# Patient Record
Sex: Male | Born: 1990 | Race: Black or African American | Hispanic: No | Marital: Single | Smoking: Never smoker
Health system: Southern US, Community
[De-identification: ages and names within clinical notes are randomized; demographics above are authoritative.]

## PROBLEM LIST (undated history)

## (undated) DIAGNOSIS — F419 Anxiety disorder, unspecified: Secondary | ICD-10-CM

## (undated) DIAGNOSIS — F411 Generalized anxiety disorder: Secondary | ICD-10-CM

## (undated) HISTORY — DX: Anxiety disorder, unspecified: F41.9

## (undated) HISTORY — DX: Generalized anxiety disorder: F41.1

---
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
## 2010-01-05 IMAGING — CR DG KNEE COMPLETE 4+V*L*
1 series · 4 of 4 positions shown · non-contrast
Comparison: none

REASON FOR EXAM: CHRONIC KNEE PAIN LT > RT
COMMENTS:

PROCEDURE:     MDR - MDR KNEE LT COMPLETE W/OBLIQUES  - January 13, 2008  [DATE]
RESULT:     No fracture, dislocation or other acute bony abnormality is
identified. The knee joint space is well maintained. The patella is intact.

[Series 1: view not recorded · 0.17mm/px · 4 of 4 slices shown]
[im 1/4]
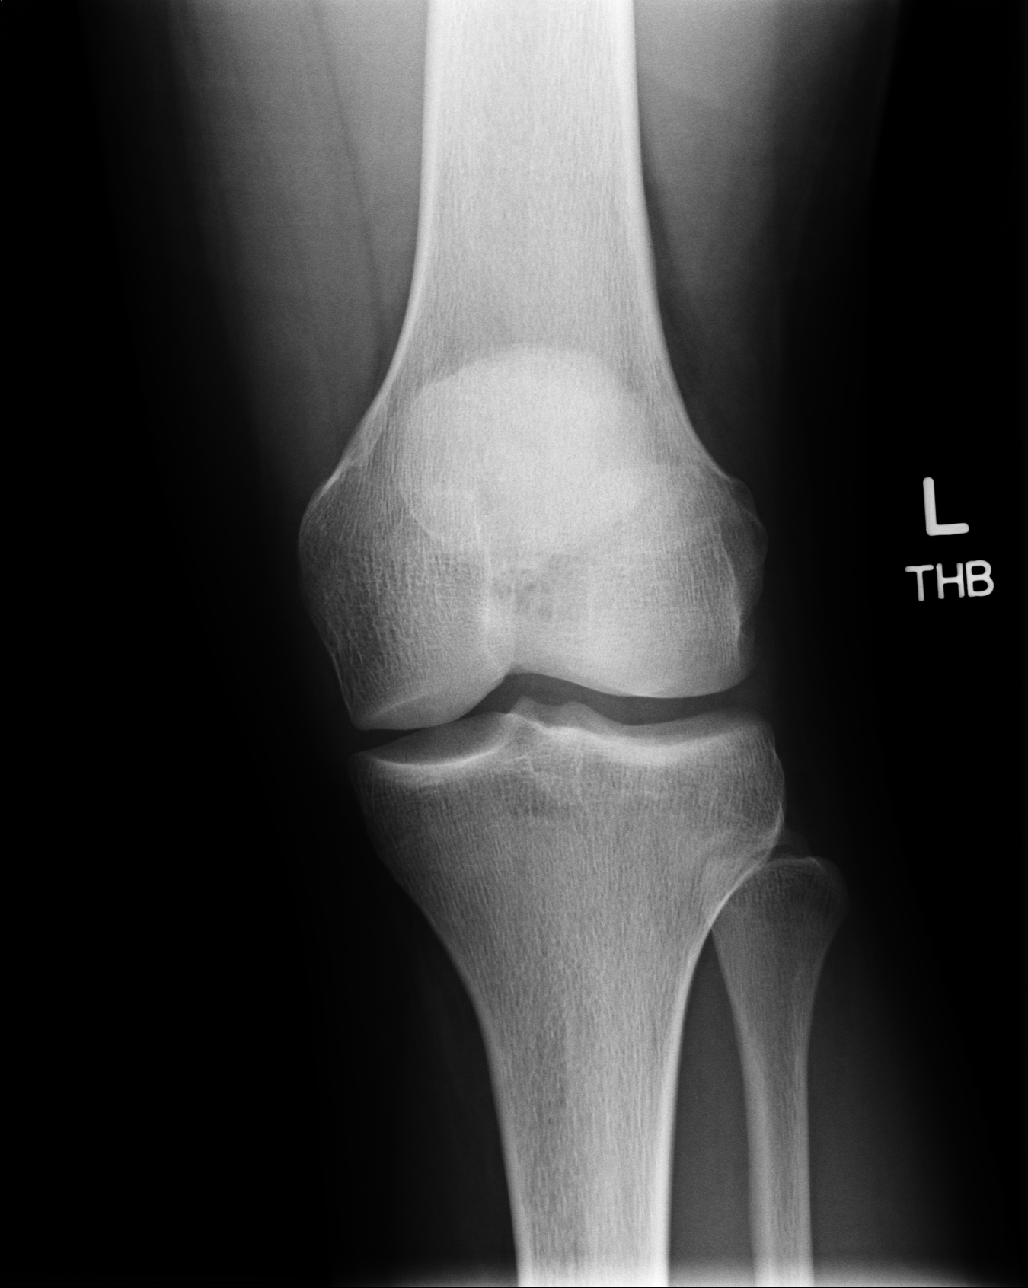
[im 2/4]
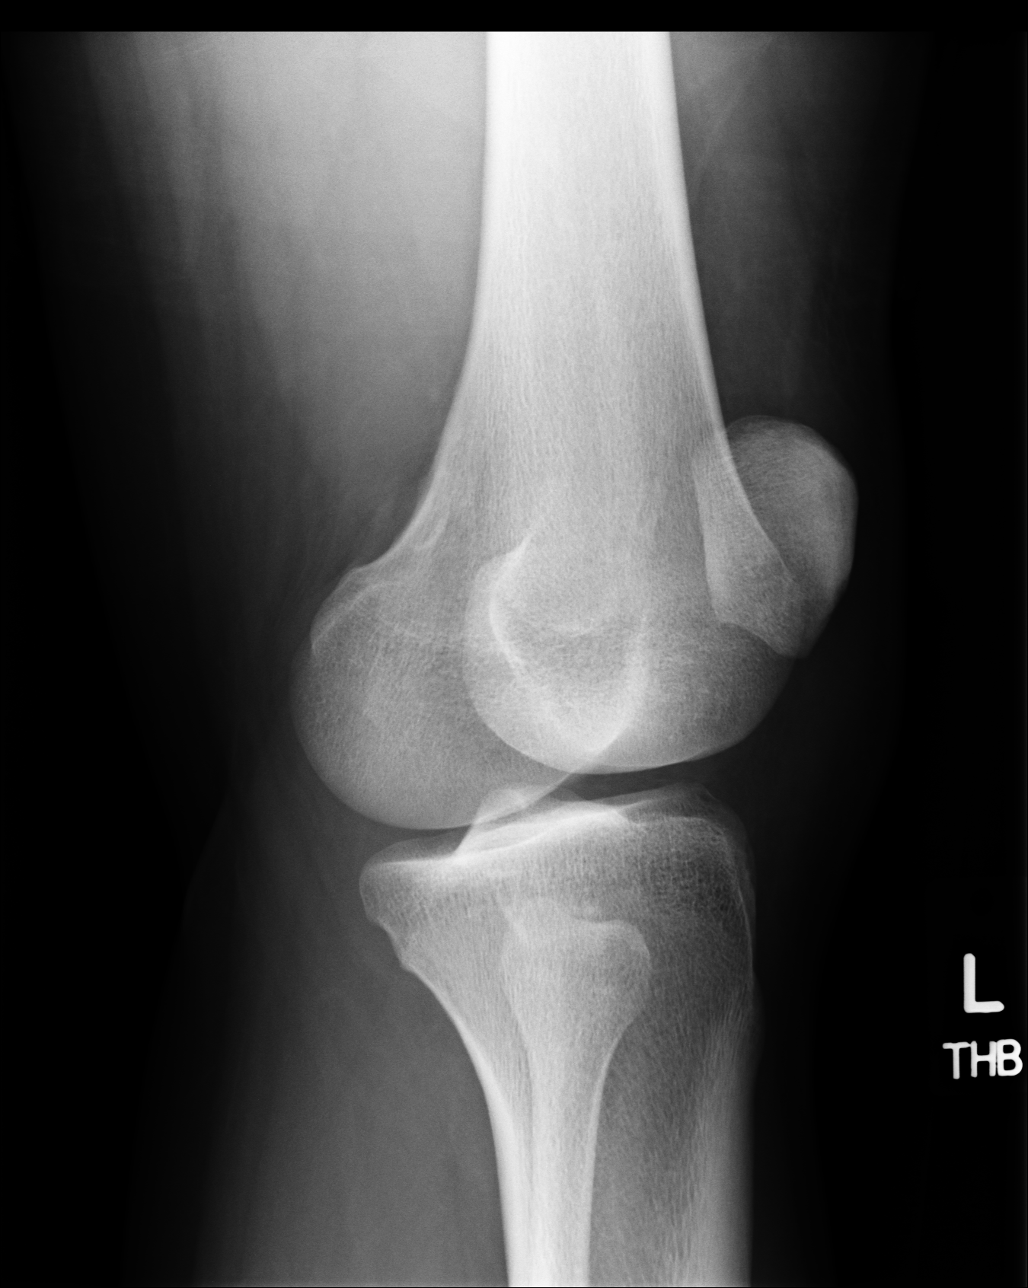
[im 3/4]
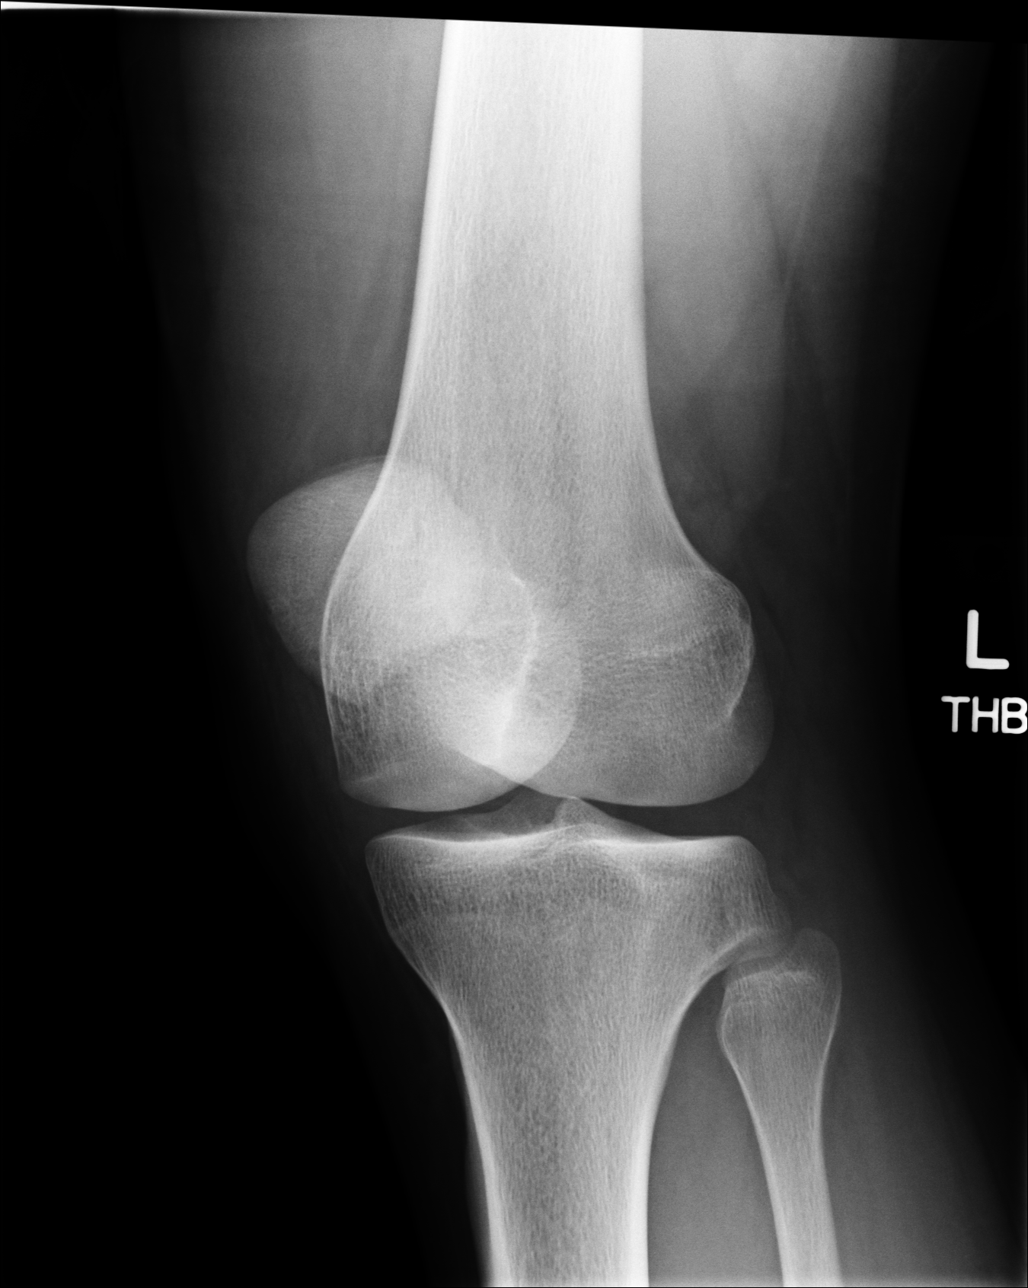
[im 4/4]
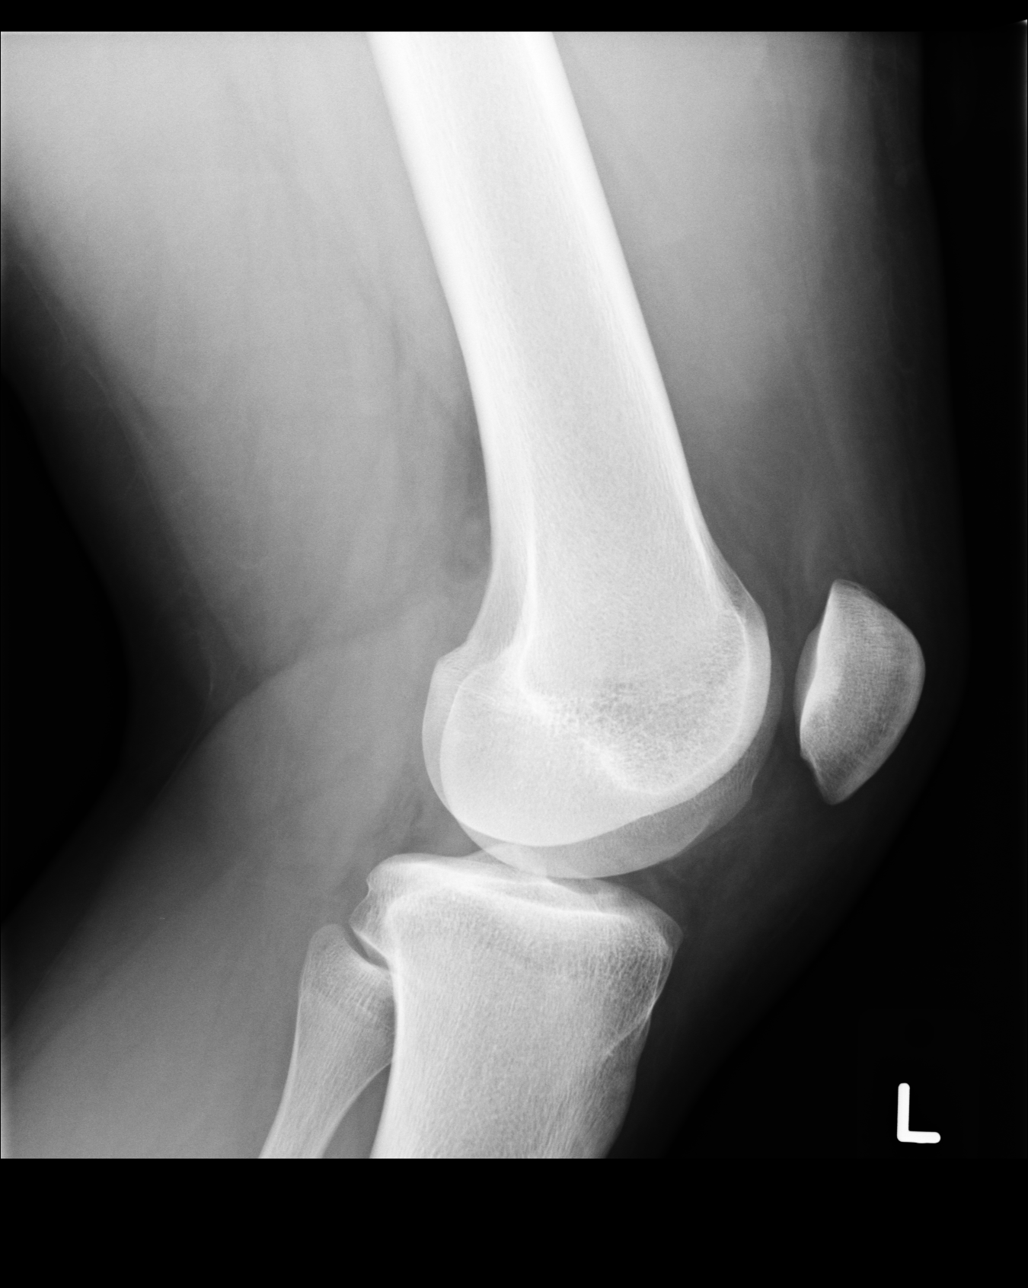

[4 of 4 positions shown; findings below may reference images not displayed]

IMPRESSION: 1.     No acute changes are identified.

## 2011-04-24 ENCOUNTER — Emergency Department: Payer: Self-pay | Admitting: Emergency Medicine

## 2014-06-11 ENCOUNTER — Emergency Department: Payer: Self-pay | Admitting: Emergency Medicine

## 2015-09-06 ENCOUNTER — Encounter: Payer: Self-pay | Admitting: Primary Care

## 2015-09-06 ENCOUNTER — Ambulatory Visit (INDEPENDENT_AMBULATORY_CARE_PROVIDER_SITE_OTHER): Payer: Commercial Managed Care - HMO | Admitting: Primary Care

## 2015-09-06 VITALS — BP 126/84 | HR 83 | Temp 98.0°F | Ht 75.0 in | Wt 224.4 lb

## 2015-09-06 DIAGNOSIS — F411 Generalized anxiety disorder: Secondary | ICD-10-CM | POA: Diagnosis not present

## 2015-09-06 MED ORDER — ESCITALOPRAM OXALATE 10 MG PO TABS: 10.0000 mg | ORAL_TABLET | Freq: Every day | ORAL | Status: DC

## 2015-09-06 NOTE — Progress Notes (Signed)
   Subjective:    Patient ID: Justin Mccarthy, male    DOB: April 01, 1991, 25 y.o.   MRN: [id number]  HPI  Justin Mccarthy is a 25 year old male who presents today to establish care and discuss the problems mentioned below. Will obtain old records. His last physical was 2 years ago.  1) Generalized Anxiety Disorder: History of in grade school and high school years. Once managed on numerous medication years ago but does not remember which ones in particular. He endorses going through a "rough time" right now and has had increased anxiety that has been present since early January 2017. He's had 2-3 anxiety attacks over the last several weeks with the last anxiety attack being Friday last week that required him to have to take a short leave from work. Over the past 2 weeks he's experienced heart palpitations, shortness of breath, feeling weakness to his legs.  He has been working through his anxiety with deep breathing exercisins and has been able to calm himself down. Denies SI/HI, symptoms of depression. GAD 7 score of 15 today.   His last major episode was in November of 2015 as he presented to the ED due to increased anxiety.    Review of Systems  Constitutional: Negative for unexpected weight change.  HENT: Negative for rhinorrhea.   Respiratory: Positive for shortness of breath. Negative for cough.   Cardiovascular: Positive for palpitations. Negative for chest pain.  Gastrointestinal: Negative for diarrhea and constipation.  Genitourinary: Negative for difficulty urinating.  Musculoskeletal: Negative for myalgias and arthralgias.  Skin: Negative for rash.  Allergic/Immunologic: Positive for environmental allergies.  Neurological: Negative for dizziness, numbness and headaches.       Past Medical History  Diagnosis Date  . Generalized anxiety disorder     Social History   Social History  . Marital Status: Single    Spouse Name: N/A  . Number of Children: N/A  . Years of Education: N/A     Occupational History  . Not on file.   Social History Main Topics  . Smoking status: Never Smoker   . Smokeless tobacco: Not on file  . Alcohol Use: No  . Drug Use: No  . Sexual Activity: Not on file   Other Topics Concern  . Not on file   Social History Narrative   Single.   No children.   Works at [employer].    Enjoys working out at [employer]    History reviewed. No pertinent past surgical history.  Family History  Problem Relation Age of Onset  . Heart disease Maternal Grandmother     No Known Allergies  No current outpatient prescriptions on file prior to visit.   No current facility-administered medications on file prior to visit.    BP 126/84 mmHg  Pulse 83  Temp(Src) 98 F (36.7 C) (Oral)  Ht  (1.905 m)  Wt 224 lb 6.4 oz (101.787 kg)  BMI 28.05 kg/m2  SpO2 98%    Objective:   Physical Exam  Constitutional: He is oriented to person, place, and time. He appears well-nourished.  Cardiovascular: Normal rate and regular rhythm.   Pulmonary/Chest: Effort normal and breath sounds normal.  Neurological: He is alert and oriented to person, place, and time.  Skin: Skin is warm and dry.  Psychiatric: He has a normal mood and affect.  Poor eye contact, but cooperative and pleasant.           Assessment & Plan:

## 2015-09-06 NOTE — Patient Instructions (Signed)
Start Lexapro tablets for Generalized Anxiety Disorder. Take 1/2 tablet by mouth daily for 6 days, then advance to 1 full tablet thereafter.  This medication will take 4-6 weeks to reach its full potential. Please notify me if you'd like to consider therapy.  Follow up in 4-6 weeks for re-evaluation and for complete physical.  It was a pleasure to meet you today! Please don't hesitate to call me with any questions. Welcome to [facility]!

## 2015-09-06 NOTE — Assessment & Plan Note (Signed)
History of in the past.  Symptoms resurfaced in early January 2017, worse over past several weeks with panic attacks. Denies SI/HI. Exam with GAD 7 score of 15 today. Stable exam, does not appear to be in distress.  Offered to start with therapy, but he would like to try medication. Will start Lexapro 10 mg tablets today. Patient is to take 1/2 tablet daily for 6 days, then advance to 1 full tablet thereafter. We discussed possible side effects of headache, GI upset, drowsiness, and SI/HI. If thoughts of SI/HI develop, we discussed to present to the emergency immediately. Patient verbalized understanding.   Follow up in 4 to 6 weeks.

## 2015-09-26 ENCOUNTER — Other Ambulatory Visit: Payer: Self-pay | Admitting: Primary Care

## 2015-09-26 DIAGNOSIS — Z0000 Encounter for general adult medical examination without abnormal findings: Secondary | ICD-10-CM

## 2015-10-04 ENCOUNTER — Other Ambulatory Visit (INDEPENDENT_AMBULATORY_CARE_PROVIDER_SITE_OTHER): Payer: Commercial Managed Care - HMO

## 2015-10-04 DIAGNOSIS — Z0000 Encounter for general adult medical examination without abnormal findings: Secondary | ICD-10-CM | POA: Diagnosis not present

## 2015-10-04 LAB — LIPID PANEL
CHOL/HDL RATIO: 3
Cholesterol: 172 mg/dL (ref 0–200)
HDL: 66.7 mg/dL (ref >39.00)
LDL Cholesterol: 96 mg/dL (ref 0–99)
NONHDL: 105.74
TRIGLYCERIDES: 49 mg/dL (ref 0.0–149.0)
VLDL: 9.8 mg/dL (ref 0.0–40.0)

## 2015-10-04 LAB — COMPREHENSIVE METABOLIC PANEL
ALT: 26 U/L (ref 0–53)
AST: 21 U/L (ref 0–37)
Albumin: 4.6 g/dL (ref 3.5–5.2)
Alkaline Phosphatase: 50 U/L (ref 39–117)
BUN: 19 mg/dL (ref 6–23)
CALCIUM: 9.8 mg/dL (ref 8.4–10.5)
CHLORIDE: 102 meq/L (ref 96–112)
CO2: 31 meq/L (ref 19–32)
Creatinine, Ser: 1.2 mg/dL (ref 0.40–1.50)
GFR: 95.51 mL/min (ref >60.00)
GLUCOSE: 98 mg/dL (ref 70–99)
POTASSIUM: 4 meq/L (ref 3.5–5.1)
Sodium: 137 mEq/L (ref 135–145)
Total Bilirubin: 1.3 mg/dL — ABNORMAL HIGH (ref 0.2–1.2)
Total Protein: 7.3 g/dL (ref 6.0–8.3)

## 2015-10-11 ENCOUNTER — Encounter: Payer: Self-pay | Admitting: Primary Care

## 2015-10-11 ENCOUNTER — Ambulatory Visit (INDEPENDENT_AMBULATORY_CARE_PROVIDER_SITE_OTHER): Payer: 59 | Admitting: Primary Care

## 2015-10-11 VITALS — BP 120/66 | HR 51 | Temp 97.9°F | Ht 75.0 in | Wt 224.8 lb

## 2015-10-11 DIAGNOSIS — F411 Generalized anxiety disorder: Secondary | ICD-10-CM | POA: Diagnosis not present

## 2015-10-11 DIAGNOSIS — Z23 Encounter for immunization: Secondary | ICD-10-CM | POA: Diagnosis not present

## 2015-10-11 DIAGNOSIS — Z0000 Encounter for general adult medical examination without abnormal findings: Secondary | ICD-10-CM | POA: Diagnosis not present

## 2015-10-11 NOTE — Patient Instructions (Signed)
Continue Citalopram 10 mg tablets for anxiety. Please e-mail or call me if you notice increased anxiety or panic attacks.  Your labs today look great. Work to decrease fast food, fried foods, sodas. Increase consumption of lean protein, vegetables, fruits, water.  You may start exercising again, but start out slow and work your way back up to your prior routine.  You've been provided with a tetanus vaccination today, which will cover you for 10 years.  Follow up in 3 months for re-evaluation of anxiety, or sooner if needed.  It was a pleasure to see you today!

## 2015-10-11 NOTE — Progress Notes (Signed)
Subjective:    Patient ID: Justin Mccarthy, male    DOB: [birth year], 25 y.o.   MRN: [id number]  HPI  [name] is a 25 year old male who presents today for complete physical and for follow up of anxiety.  Immunizations: -Tetanus: Completed in 2006. Due again. -Influenza: Did not receive last season.   Diet: Endorses a fair diet. Breakfast: Cereal, oatmeal, fruit, breakfast bar, fast food Lunch: Fast food, or lean pocket, yogurt, chips Dinner: Fast food, restaurants Snacks: Chips Desserts: 3-4 days a week Beverages: Water, soda, flavored water  Exercise: He's currently not exercising Eye exam: Completed several years ago. Dental exam: Completed 2 years ago  1) Generalized Anxiety Disorder: Long history of. Recently evaluated in early February 2017 with complaints of panic attacks and increased anxiety. He was initiated on Lexapro 10 mg at his prior visit.   Since his last visit he's noticed an improvement in his anxiety and has not experienced any major panic attacks. He did experience one 2 days after starting the medication, but since then has not. He does some attacks but they are on a much smaller scale and are manageable.    Review of Systems  Constitutional: Negative for unexpected weight change.  HENT: Negative for rhinorrhea.   Respiratory: Negative for cough and shortness of breath.   Cardiovascular: Negative for chest pain.  Gastrointestinal: Negative for diarrhea and constipation.  Genitourinary: Negative for difficulty urinating.  Musculoskeletal: Negative for myalgias and arthralgias.  Skin: Negative for rash.  Allergic/Immunologic: Negative for environmental allergies.  Neurological: Negative for dizziness, numbness and headaches.  Psychiatric/Behavioral:       See HPI       Past Medical History  Diagnosis Date  . Generalized anxiety disorder     Social History   Social History  . Marital Status: Single    Spouse Name: N/A  . Number of Children:  N/A  . Years of Education: N/A   Occupational History  . Not on file.   Social History Main Topics  . Smoking status: Never Smoker   . Smokeless tobacco: Not on file  . Alcohol Use: No  . Drug Use: No  . Sexual Activity: Not on file   Other Topics Concern  . Not on file   Social History Narrative   Single.   No children.   Works at [employer].    Enjoys working out at [employer].    No past surgical history on file.  Family History  Problem Relation Age of Onset  . Heart disease Maternal Grandmother     No Known Allergies  Current Outpatient Prescriptions on File Prior to Visit  Medication Sig Dispense Refill  . escitalopram (LEXAPRO) 10 MG tablet Take 1 tablet (10 mg total) by mouth daily. 30 tablet 3   No current facility-administered medications on file prior to visit.    BP 120/66 mmHg  Pulse 51  Temp(Src) 97.9 F (36.6 C) (Oral)  Ht 6\' 3"  (1.905 m)  Wt 224 lb 12.8 oz (101.969 kg)  BMI 28.10 kg/m2  SpO2 99%    Objective:   Physical Exam  Constitutional: He is oriented to person, place, and time. He appears well-nourished.  HENT:  Right Ear: Tympanic membrane and ear canal normal.  Left Ear: Tympanic membrane and ear canal normal.  Nose: Nose normal. Right sinus exhibits no maxillary sinus tenderness and no frontal sinus tenderness. Left sinus exhibits no maxillary sinus tenderness and no frontal sinus tenderness.  Mouth/Throat: Oropharynx is clear and moist.  Eyes: Conjunctivae and EOM are normal. Pupils are equal, round, and reactive to light.  Neck: Neck supple. No thyromegaly present.  Cardiovascular: Normal rate, regular rhythm and normal heart sounds.   Pulmonary/Chest: Effort normal and breath sounds normal. He has no wheezes. He has no rales.  Abdominal: Soft. Bowel sounds are normal. There is no tenderness.  Musculoskeletal: Normal range of motion.  Neurological: He is alert and oriented to person, place, and time. He has normal reflexes. No  cranial nerve deficit.  Skin: Skin is warm and dry.  Psychiatric: He has a normal mood and affect.          Assessment & Plan:

## 2015-10-11 NOTE — Assessment & Plan Note (Signed)
Tdap due and provided today. Did not receive flu vaccination last season, declines today. Discussed the importance of a healthy diet and regular exercise in order for weight loss and to reduce risk of other medical diseases.  Labs unremarkable. Exam unremarkable.  Follow up in 1 year for repeat physical.

## 2015-10-11 NOTE — Assessment & Plan Note (Signed)
Improved on Lexapro 10 mg. Overall no major panic attacks. Several smaller bouts of anxiety but is manageable per patient. Will continue current dose. Denies SI/HI. Follow up in 3 months.

## 2015-10-11 NOTE — Progress Notes (Signed)
Pre visit review using our clinic review tool, if applicable. No additional management support is needed unless otherwise documented below in the visit note. 

## 2015-11-16 ENCOUNTER — Other Ambulatory Visit: Payer: Self-pay | Admitting: Primary Care

## 2015-11-16 DIAGNOSIS — F411 Generalized anxiety disorder: Secondary | ICD-10-CM

## 2015-11-16 MED ORDER — ESCITALOPRAM OXALATE 10 MG PO TABS: 10.0000 mg | ORAL_TABLET | Freq: Every day | ORAL | Status: DC

## 2015-11-16 NOTE — Telephone Encounter (Signed)
Received request from [facility] to change to 90 days supply.  Prescription last prescribed on 09/06/2015. Last seen on 10/11/2015. Follow up on 01/12/2016.  Sent the change to [facility] as requested.

## 2016-01-12 ENCOUNTER — Ambulatory Visit (INDEPENDENT_AMBULATORY_CARE_PROVIDER_SITE_OTHER): Payer: 59 | Admitting: Primary Care

## 2016-01-12 VITALS — BP 116/78 | HR 64 | Temp 98.0°F | Ht 75.0 in | Wt 217.4 lb

## 2016-01-12 DIAGNOSIS — F411 Generalized anxiety disorder: Secondary | ICD-10-CM

## 2016-01-12 NOTE — Patient Instructions (Signed)
It's very important to take the Lexapro 10 mg tablets everyday.  Please notify me if you decide to participate in therapy. Also notify me if you start experiencing more frequent/intense panic attacks.  Follow up in 6 months for re-evaluation or sooner if needed.  It was a pleasure to see you today!

## 2016-01-12 NOTE — Progress Notes (Signed)
Pre visit review using our clinic review tool, if applicable. No additional management support is needed unless otherwise documented below in the visit note. 

## 2016-01-12 NOTE — Progress Notes (Signed)
   Subjective:    Patient ID: Justin Mccarthy, male    DOB: 1990-11-15, 25 y.o.   MRN: [id number]  HPI  [name] is a 25 year old male who presents today for follow up of anxiety. He was initiated on Lexapro 10 mg in February 2017, with improvement in anxiety and panic attacks during re-evaluation in March 2017.  Since his last visit he's continued to notice improvement when he's taking his medication. He's not taken this medication consistently as he forgets. He will take his Lexapro twice weekly on average. His panic attacks are improved, but he does still experience them. He's experiencing panic attacks once weekly on average which he can manage by talking through. He does occasionally have difficulty falling asleep.  Denies SI/HI, nausea, headaches.  Review of Systems  Respiratory: Negative for shortness of breath.   Cardiovascular: Negative for chest pain.  Gastrointestinal: Negative for nausea.  Neurological: Negative for headaches.  Psychiatric/Behavioral: The patient is nervous/anxious.        Past Medical History  Diagnosis Date  . Generalized anxiety disorder      Social History   Social History  . Marital Status: Single    Spouse Name: N/A  . Number of Children: N/A  . Years of Education: N/A   Occupational History  . Not on file.   Social History Main Topics  . Smoking status: Never Smoker   . Smokeless tobacco: Not on file  . Alcohol Use: No  . Drug Use: No  . Sexual Activity: Not on file   Other Topics Concern  . Not on file   Social History Narrative   Single.   No children.   Works at [employer].    Enjoys working out at [employer].    No past surgical history on file.  Family History  Problem Relation Age of Onset  . Heart disease Maternal Grandmother     No Known Allergies  Current Outpatient Prescriptions on File Prior to Visit  Medication Sig Dispense Refill  . escitalopram (LEXAPRO) 10 MG tablet Take 1 tablet (10 mg total) by mouth  daily. 90 tablet 1   No current facility-administered medications on file prior to visit.    BP 116/78 mmHg  Pulse 64  Temp(Src) 98 F (36.7 C) (Oral)  Ht 6\' 3"  (1.905 m)  Wt 217 lb 6.4 oz (98.612 kg)  BMI 27.17 kg/m2  SpO2 98%    Objective:   Physical Exam  Constitutional: He appears well-nourished.  Cardiovascular: Normal rate and regular rhythm.   Pulmonary/Chest: Effort normal and breath sounds normal.  Skin: Skin is warm and dry.  Psychiatric: He has a normal mood and affect.  Poor eye contact, pleasant, cooperative          Assessment & Plan:

## 2016-01-12 NOTE — Assessment & Plan Note (Signed)
Overall improved on Lexapro, but not taking consistently. Still experiencing panic attacks once weekly although improved. Denies SI/HI. Discussed importance of compliance to medications. He declines therapy and hydroxyzine to use as needed for breakthrough panic attacks. Will have him follow up in 6 months for re-evaluation. May need to consider adding on medication if no improvement.

## 2016-10-02 ENCOUNTER — Encounter: Payer: Self-pay | Admitting: Family Medicine

## 2016-10-02 ENCOUNTER — Ambulatory Visit (INDEPENDENT_AMBULATORY_CARE_PROVIDER_SITE_OTHER): Payer: PRIVATE HEALTH INSURANCE | Admitting: Family Medicine

## 2016-10-02 VITALS — BP 126/74 | HR 79 | Temp 98.8°F | Resp 16 | Ht 75.0 in | Wt 222.2 lb

## 2016-10-02 DIAGNOSIS — N342 Other urethritis: Secondary | ICD-10-CM

## 2016-10-02 DIAGNOSIS — F411 Generalized anxiety disorder: Secondary | ICD-10-CM | POA: Diagnosis not present

## 2016-10-02 DIAGNOSIS — Z202 Contact with and (suspected) exposure to infections with a predominantly sexual mode of transmission: Secondary | ICD-10-CM

## 2016-10-02 DIAGNOSIS — R002 Palpitations: Secondary | ICD-10-CM | POA: Insufficient documentation

## 2016-10-02 DIAGNOSIS — Z7689 Persons encountering health services in other specified circumstances: Secondary | ICD-10-CM | POA: Diagnosis not present

## 2016-10-02 MED ORDER — AZITHROMYCIN 500 MG PO TABS: 1000.0000 mg | ORAL_TABLET | Freq: Once | ORAL | 0 refills | Status: AC

## 2016-10-02 MED ORDER — CEFTRIAXONE SODIUM 500 MG IJ SOLR
250.0000 mg | Freq: Once | INTRAMUSCULAR | Status: AC
  Administered 2016-10-02: 250 mg via INTRAMUSCULAR

## 2016-10-02 NOTE — Assessment & Plan Note (Addendum)
Suspected intermittent brief asymptomatic palpitations, closely related to anxiety/panic, patient seems hyper-aware of these palpitations. No sustained episodes and not seem to trigger anxiety or other systemic symptoms. No cardiac risk symptoms.  Plan: 1. Reassurance, manage underlying anxiety 2. Given patient here to establish, last labs >1 year ago, will check some labs, CMET and TSH, future [facility] orders 3. Follow-up if worsening, advised consider EKG, vs cardiology referral for temporary cardiac monitor, discussed etiology with likely PVCs etc

## 2016-10-02 NOTE — Assessment & Plan Note (Addendum)
Concern with chronic history of anxiety with panic, suspected generalized anxiety disorder (GAD), seems intermittent based on life stressors, mostly self treated with behavioral techniques to control panic. Unable to fully determine severity / scale today based on declined GAD7. - No treatment, prior trial on SSRI Escitalopram 10mg , inconsistent dosing and self DC'd  Plan: 1. Reassurance currently, seems mostly controlled - discussion on SSRIs and anxiety therapy both medications and therapy/counseling, patient not interested today. Also discussed likely palpitations related to anxiety/panic, likely benign, see A&P 2. Handout printed given with local psychology/therapy options, also given [facility] self referral if need for psych/therapy locally 3. Follow-up as needed

## 2016-10-02 NOTE — Progress Notes (Signed)
Subjective:    Patient ID: Justin Mccarthy, male    DOB: [birth year], 26 y.o.   MRN: [id number]  Justin Mccarthy is a 26 y.o. male presenting on 10/02/2016 for Establish Care  Previously established with [facility]. Now here to establish with new PCP.  HPI   Chronic Anxiety, with h/o Panic / H/o Palpitations: - Reports chronic problem since Middle School >12+ years, anxiety and associated panic attacks, has had worsening problems in the past, now seems improved. Recently has been more under control, usually does mindfulness and counting techniques to control acute anxiety / panic episodes. - He has no acute concerns about anxiety at this time. Not interested in medication. - Previous history treated with Escitalopram 10mg  daily for anxiety by prior PCP about 1 year ago, did not adhere to it regularly, some mixed results. No other prior Psych meds or SSRI trials. Only history of therapist/counseling in middle school - Additional symptoms with some associated insomnia, difficulty falling asleep and staying asleep, goes to bed around 10-11pm, then falls asleep within 1 hour or less, will wake up around 0400 and 0500 for work. Now difficulty sleeping in on weekends but his overall insomnia has improved. - Also expresses concern with history of some heart palpitations closed associated with anxiety/panic, describes some heart palpitations, feels like a "skipped beat or a thud", not constant sustained skipped beat, will only be intermittent abnormal beats, can be multiple times a day, has prior history of this for years intermittently, had periods in life without any significant symptoms of palpitations, worse with recent personal issues and anxiety. Asking about safety of flying on plane with occasional palpitations - Family history of Anxiety Mother. No other fam history - No other medications for these problems - Wants to get back to gym, but limited by anxiety, some situational and social  anxiety - Denies suicidal or homicidal ideation, chest pain, dyspnea, sweating / diaphoresis, nausea  STD TESTING / URETHRITIS: - Today requests STD testing. No prior history of STD before. Last sexual contact 2-3 weeks ago, unprotected intercourse, male partner with vaginal intercourse, 1 week later, now not every void with dysuria, small amount of clear discharge or drip/leakage from penis. - No known exposure or STD with sexual partner - Denies any foul odor purulence, fevers/chills, nausea, vomiting, no penile rash or ulcer  Additional Soc Hx: Switched jobs from [employer] to [employer] (7-8 months)   Past Medical History:  Diagnosis Date  . Anxiety   . Generalized anxiety disorder    History reviewed. No pertinent surgical history. Social History   Social History  . Marital status: Single    Spouse name: N/A  . Number of children: N/A  . Years of education: High School   Occupational History  . [employer] (Store stocking)    Social History Main Topics  . Smoking status: Never Smoker  . Smokeless tobacco: Never Used  . Alcohol use No  . Drug use: No  . Sexual activity: Not on file   Other Topics Concern  . Not on file   Social History Narrative   Single.   No children.   Works at [employer].    Enjoys working out at [employer] gym.   Family History  Problem Relation Age of Onset  . Kidney disease Maternal Grandmother   . Anxiety disorder Mother    No current outpatient prescriptions on file prior to visit.   No current facility-administered medications on file prior to visit.  Review of Systems Per HPI unless specifically indicated above     Objective:    BP 126/74   Pulse 79   Temp 98.8 F (37.1 C) (Oral)   Resp 16   Ht 6\' 3"  (1.905 m)   Wt 222 lb 3.2 oz (100.8 kg)   BMI 27.77 kg/m   Wt Readings from Last 3 Encounters:  10/02/16 222 lb 3.2 oz (100.8 kg)  01/12/16 217 lb 6.4 oz (98.6 kg)  10/11/15 224 lb 12.8 oz (102 kg)    Physical Exam    Constitutional: He is oriented to person, place, and time. He appears well-developed and well-nourished. No distress.  Well-appearing, comfortable, cooperative  HENT:  Head: Normocephalic and atraumatic.  Mouth/Throat: Oropharynx is clear and moist.  Frontal / maxillary sinuses non-tender. Nares patent without congestion. Bilateral TMs clear without erythema, effusion or bulging. Oropharynx clear without erythema, exudates, edema or asymmetry.  Eyes: Conjunctivae and EOM are normal. Pupils are equal, round, and reactive to light. Right eye exhibits no discharge. Left eye exhibits no discharge.  Neck: Normal range of motion. Neck supple. No thyromegaly present.  No carotid bruits  Cardiovascular: Normal rate, regular rhythm, normal heart sounds and intact distal pulses.   No murmur heard. No ectopy  Pulmonary/Chest: Effort normal and breath sounds normal. No respiratory distress. He has no wheezes. He has no rales.  Good air movement  Abdominal: Soft. Bowel sounds are normal. He exhibits no distension and no mass. There is no tenderness.  Genitourinary:  Genitourinary Comments: External Genital exam - normal inguinal region, no lymphadenopathy, ulceration or rash. Normal penis, circumcised, very mild palpable discharge without obvious purulence, no ulceration on penis, normal scrotum, non tender testicles, no masses  Hernia exam checked bilateral inguinal canals without significant palpable herniation or discomfort on valsalva  Musculoskeletal: Normal range of motion. He exhibits no edema or tenderness.  Lymphadenopathy:    He has no cervical adenopathy.  Neurological: He is alert and oriented to person, place, and time.  Distal sensation intact to light touch  Skin: Skin is warm and dry. No rash noted. He is not diaphoretic.  Psychiatric: He has a normal mood and affect. His behavior is normal.  Well groomed, good eye contact, normal speech and thoughts, mildly anxious at times with some  questions but overall seems very calm and comfortable  Nursing note and vitals reviewed.   I have personally reviewed the following lab results from 10/04/15.  Results for orders placed or performed in visit on 10/04/15  Comprehensive metabolic panel  Result Value Ref Range   Sodium 137 135 - 145 mEq/L   Potassium 4.0 3.5 - 5.1 mEq/L   Chloride 102 96 - 112 mEq/L   CO2 31 19 - 32 mEq/L   Glucose, Bld 98 70 - 99 mg/dL   BUN 19 6 - 23 mg/dL   Creatinine, Ser 1.61 0.40 - 1.50 mg/dL   Total Bilirubin 1.3 (H) 0.2 - 1.2 mg/dL   Alkaline Phosphatase 50 39 - 117 U/L   AST 21 0 - 37 U/L   ALT 26 0 - 53 U/L   Total Protein 7.3 6.0 - 8.3 g/dL   Albumin 4.6 3.5 - 5.2 g/dL   Calcium 9.8 8.4 - 09.6 mg/dL   GFR 04.54 >09.81 mL/min  Lipid panel  Result Value Ref Range   Cholesterol 172 0 - 200 mg/dL   Triglycerides 19.1 0.0 - 149.0 mg/dL   HDL 47.82 >95.62 mg/dL   VLDL 9.8 0.0 -  40.0 mg/dL   LDL Cholesterol 96 0 - 99 mg/dL   Total CHOL/HDL Ratio 3    NonHDL 105.74       Assessment & Plan:   Problem List Items Addressed This Visit    Intermittent palpitations    Suspected intermittent brief asymptomatic palpitations, closely related to anxiety/panic, patient seems hyper-aware of these palpitations. No sustained episodes and not seem to trigger anxiety or other systemic symptoms. No cardiac risk symptoms.  Plan: 1. Reassurance, manage underlying anxiety 2. Given patient here to establish, last labs >1 year ago, will check some labs, CMET and TSH, future [facility] orders 3. Follow-up if worsening, advised consider EKG, vs cardiology referral for temporary cardiac monitor, discussed etiology with likely PVCs etc      Generalized anxiety disorder    Concern with chronic history of anxiety with panic, suspected generalized anxiety disorder (GAD), seems intermittent based on life stressors, mostly self treated with behavioral techniques to control panic. Unable to fully determine severity / scale  today based on declined GAD7. - No treatment, prior trial on SSRI Escitalopram 10mg , inconsistent dosing and self DC'd  Plan: 1. Reassurance currently, seems mostly controlled - discussion on SSRIs and anxiety therapy both medications and therapy/counseling, patient not interested today. Also discussed likely palpitations related to anxiety/panic, likely benign, see A&P 2. Handout printed given with local psychology/therapy options, also given [facility] self referral if need for psych/therapy locally 3. Follow-up as needed      Relevant Orders   Comprehensive metabolic panel   TSH    Other Visit Diagnoses    Encounter to establish care with new doctor    -  Primary   Relevant Orders   Comprehensive metabolic panel   Possible exposure to STD       Relevant Medications   cefTRIAXone (ROCEPHIN) injection 250 mg (Completed)   azithromycin (ZITHROMAX) 500 MG tablet   Other Relevant Orders   HIV antibody   RPR   GC/Chlamydia Probe Amp   Urethritis      Acute symptoms urethritis, mild discharge, dysuria, 1 week following known recent episode of high risk sexual behavior with unprotected intercourse with relatively new partner (without known STD exposure) - No prior STD dx.  Plan: 1. Check urine cytology GC/Chlamydia - [facility] order, printed, to get collected 1st void this week 2. Check HIV, RPR - at [facility] along with other fasting AM labs chemistry 3. Empiric treatment for both GC/Chlamydia urethritis today with Ceftriaxone 250mg  IM x 1 and Azithromycin 1g PO x 1 dose sent to pharmacy 4. Counseled on no sexual intercourse for 7-10 days, advised regular condom use 5. Return criteria given (especially advised to call if n/v up pills today, will need longer duration alternate dosing therapy for chlamydia coverage).     Relevant Medications   cefTRIAXone (ROCEPHIN) injection 250 mg (Completed)   azithromycin (ZITHROMAX) 500 MG tablet   Other Relevant Orders   GC/Chlamydia Probe Amp        Meds ordered this encounter  Medications  . cefTRIAXone (ROCEPHIN) injection 250 mg  . azithromycin (ZITHROMAX) 500 MG tablet    Sig: Take 2 tablets (1,000 mg total) by mouth once. Take with food. If nausea/vomit, then notify doctor office for alternative rx.    Dispense:  2 tablet    Refill:  0      Follow up plan: Return in about 3 months (around 01/02/2017) for Anxiety.  Saralyn Pilar, DO [facility] 10/02/2016,  6:26 PM

## 2016-10-02 NOTE — Patient Instructions (Signed)
Thank you for coming in to clinic today.  1. Labs ordered, please get blood and urine tests tomorrow or this week, [facility] - For urine, don't void / urinate before going to lab, they need to collect your FIRST VOID of the day - Once results available will release to mychart with comments  Received antibiotics today, injection and rx for Azithromycin take both pills at once with food tonight, if nausea / vomiting and pills come back, then need to call us for a daily rx for 1 week  Avoid sexual contact for up to 1 week, while treated and awaiting results  For anxiety - Reassurance today, regarding the palpitations of heart - No further testing at this time, if develop worsening symptoms or other concerns with more persistent panic and palpitations, can discuss repeat EKG, and even referral to Cardiology for heart monitor    Psych Counseling ONLY  Self Referral:  1. [facility]   Address: [address], [place], [place] [address] Hours: Open today  9AM-7PM Phone: [phone]  2. Anell Barr[address]yl [address], [facility]  - [facility] Address: [address], [place], [place] [address] Phone: [phone]   [facility] + COUNSELING  Self Referral  [facility]) [place] [address], [place], [place] [address] Phone: [phone]  Please schedule a follow-up appointment with Dr. Althea CharonKaramalegos in 3-6 months for follow-up anxiety  If you have any other questions or concerns, please feel free to call the clinic or send a message through MyChart. You may also schedule an earlier appointment if necessary.  Saralyn PilarAlexander Karamalegos, DO [facility]

## 2016-10-05 ENCOUNTER — Encounter: Payer: Self-pay | Admitting: Family Medicine

## 2016-10-05 DIAGNOSIS — R17 Unspecified jaundice: Secondary | ICD-10-CM | POA: Insufficient documentation

## 2016-10-05 LAB — COMPREHENSIVE METABOLIC PANEL
A/G RATIO: 1.8 (ref 1.2–2.2)
ALBUMIN: 4.9 g/dL (ref 3.5–5.5)
ALT: 21 IU/L (ref 0–44)
AST: 24 IU/L (ref 0–40)
Alkaline Phosphatase: 63 IU/L (ref 39–117)
BUN / CREAT RATIO: 22 — AB (ref 9–20)
BUN: 23 mg/dL — ABNORMAL HIGH (ref 6–20)
Bilirubin Total: 2.2 mg/dL — ABNORMAL HIGH (ref 0.0–1.2)
CALCIUM: 10.2 mg/dL (ref 8.7–10.2)
CO2: 25 mmol/L (ref 18–29)
Chloride: 99 mmol/L (ref 96–106)
Creatinine, Ser: 1.03 mg/dL (ref 0.76–1.27)
GFR, EST AFRICAN AMERICAN: 116 mL/min/{1.73_m2} (ref >59)
GFR, EST NON AFRICAN AMERICAN: 100 mL/min/{1.73_m2} (ref >59)
GLOBULIN, TOTAL: 2.8 g/dL (ref 1.5–4.5)
Glucose: 99 mg/dL (ref 65–99)
POTASSIUM: 4.8 mmol/L (ref 3.5–5.2)
SODIUM: 139 mmol/L (ref 134–144)
TOTAL PROTEIN: 7.7 g/dL (ref 6.0–8.5)

## 2016-10-05 LAB — HIV ANTIBODY (ROUTINE TESTING W REFLEX): HIV SCREEN 4TH GENERATION: NONREACTIVE

## 2016-10-05 LAB — GC/CHLAMYDIA PROBE AMP
Chlamydia trachomatis, NAA: NEGATIVE
Neisseria gonorrhoeae by PCR: NEGATIVE

## 2016-10-05 LAB — RPR: RPR Ser Ql: NONREACTIVE

## 2016-10-05 LAB — TSH: TSH: 0.974 u[IU]/mL (ref 0.450–4.500)

## 2016-11-08 ENCOUNTER — Ambulatory Visit: Payer: PRIVATE HEALTH INSURANCE | Admitting: Family Medicine

## 2016-11-14 ENCOUNTER — Ambulatory Visit (INDEPENDENT_AMBULATORY_CARE_PROVIDER_SITE_OTHER): Payer: PRIVATE HEALTH INSURANCE | Admitting: Family Medicine

## 2016-11-14 ENCOUNTER — Encounter: Payer: Self-pay | Admitting: Family Medicine

## 2016-11-14 VITALS — BP 131/75 | HR 75 | Temp 98.5°F | Resp 16 | Ht 75.0 in | Wt 224.6 lb

## 2016-11-14 DIAGNOSIS — R17 Unspecified jaundice: Secondary | ICD-10-CM

## 2016-11-14 DIAGNOSIS — R002 Palpitations: Secondary | ICD-10-CM

## 2016-11-14 DIAGNOSIS — F411 Generalized anxiety disorder: Secondary | ICD-10-CM | POA: Diagnosis not present

## 2016-11-14 MED ORDER — ESCITALOPRAM OXALATE 10 MG PO TABS: 10.0000 mg | ORAL_TABLET | Freq: Every day | ORAL | 5 refills | Status: DC

## 2016-11-14 NOTE — Patient Instructions (Addendum)
Thank you for coming in to clinic today.  1. As discussed, it sounds like your symptoms are primarily related to anxiety / adjustment disorder. This is a very common problem and be related to several factors, including life stressors. Start treatment with Escitalopram (generic Lexapro), take  (one tablet) daily for next 4-6 weeks. As discussed most anxiety medications are also used for mood disorders such as depression, because they work on similar chemicals in your brain. It may take up to 3-4 weeks for the medicine to take full effect and for you to notice a difference, sometimes you may notice it working sooner, otherwise we may need to adjust the dose.  May continue with Benadryl as needed at night if difficulty sleeping, let me know if interested to try Hydroxyzine prescription med  For most patients with anxiety or mood concerns, we generally recommend referral to establish with a therapist or counselor as well. This has been shown to improve the effectiveness of the medications, and in the future we may be able to taper off medications.  For anxiety - Reassurance today, regarding the palpitations of heart - No further testing at this time, if develop worsening symptoms or other concerns with more persistent panic and palpitations, can discuss repeat EKG, and even referral to Cardiology for heart monitor  In future we can consider a low dose Beta Blocker such as Metoprolol or Atenolol to help control heart rate if needed  Psych Counseling ONLY  Self Referral:  1. [facility] [place] [facility] [facility]   Address: [address] [place], [place], [place] [address] Hours: Open today  9AM-7PM Phone: [phone]  2. Anell Barr [employer], [facility]  - [facility] Address: [address] [place], [place] [address] Phone: [phone]   BOTH [facility] + COUNSELING  Self Referral  [facility]) [place] [address] [place], [place] [address] Phone: [phone]  Please schedule a follow-up appointment with Dr. Althea Charon in 4-6 weeks for follow-up anxiety, GAD7  If you have any other questions or concerns, please feel free to call the clinic or send a message through MyChart. You may also schedule an earlier appointment if necessary.  Saralyn Pilar, DO [facility], [place]

## 2016-11-14 NOTE — Assessment & Plan Note (Addendum)
Stable, intermittent brief palpitations, closely related to anxiety/panic, patient seems hyper-aware of these palpitations. No sustained episodes and not seem to trigger anxiety or other systemic symptoms. No cardiac risk symptoms. - Labs chemistry and TSH normal, without underlying etiology  Plan: 1. Reassurance, manage underlying anxiety, start SSRI Lexapro 2. Monitor symptoms and follow-up if worsening despite controlling anxiety, advised consider EKG, vs cardiology referral for temporary cardiac monitor, discussed etiology with likely PVCs etc 3. In future also discussed option for possible beta blocker low dose to help control palpitations if these are source of anxiety

## 2016-11-14 NOTE — Assessment & Plan Note (Signed)
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

## 2016-11-14 NOTE — Assessment & Plan Note (Signed)
Persistent worsening anxiety, suspected generalized anxiety disorder (GAD) with chronic anxiety and panic attacks, seems intermittent based on life stressors - GAD7 15 - Prior inconsistent dosing Escitalopram  - Uses self therapy behavioral techniques - Not established with Psychiatry/Psychology  Plan: 1. Discussion today on diagnosis, management and prognosis of chronic anxiety and the manifestation of anxiety with his panic attacks, palpitations, insomnia and other symptoms 2. Start Escitalopram  daily AM with food (if too much sedation can switch to PM dosing again, caution insomnia), counseling on potential side effects risks, reviewed possible GI intolerance, insomnia (although likely to improve this given anxiety likely source of insomnia), sexual dysfunction, reviewed black box warning inc suicidal (no prior history, unlikely concern) - anticipate 4-6 weeks for notable effect, may need titrate dose to 20 in future 3. Advised recommend therapy / counseling in future - already given handout locations/names if need, and  not ready at this time, will re-consider in future if needed 4. Follow-up 4-6 weeks anxiety, med adjust, GAD7/PHQ9 - additionally offered Hydroxyzine for PRN insomnia/panic, declined, will continue benadryl OTC PRN

## 2016-11-14 NOTE — Progress Notes (Signed)
Subjective:    Patient ID: Justin Mccarthy, male    DOB: 17-Feb-1991, 26 y.o.   MRN: [id number]  Justin Mccarthy is a 26 y.o. male presenting on 11/14/2016 for Anxiety (getting worst certain situation onset month)  HPI   Chronic Anxiety, with h/o Panic / H/o Palpitations: - Last visit 10/02/16 with me to establish care as PCP, discussed his chronic anxiety in detail, see note for background information, briefly this problem has been present for >12+ years in the past only treatment was Escitalopram  daily for up to 1 year but did not adhere to regular therapy, also never has had psychiatry or psychology therapy. Last visit he was not ready to start medication - Today he reports anxiety is worse and overall not improving. He had recent significant life stressors adding to his anxiety, also recent air travel acutely made his anxiety and panic worse on the airplane, describes still experiences palpitations, and muscle spasm or tightening in his neck all worse acutely in anxiety and resolves without anxiety. Symptoms worse on flight to [facility], and improved on flight to return home to [facility], actually did well - Admits symptoms of mind racing and constant worrying, thinking most of day interferes with his function - Admits difficulty sleeping, only getting 4-5 hour at times, lay in bed without sleeping often, he takes occasional Benadryl OTC PRN for sleep with good results - He is still concerned about the palpitations, only occur with anxiety, does not occur while exercising and does not endorse any other warning symptoms with chest pain or pressure, may get an occasional sharp brief chest pain in his side. Recent lab tests with chemistry and TSH normal without contributing factor. - He has tried to resume regular exercise at gym - Still working for new job [employer], does admit it is demanding and sometimes provokes anxiety - Family history of Anxiety Mother. No other fam history - Denies suicidal or  homicidal ideation, chest pain, dyspnea, sweating / diaphoresis, nausea  Elevated Serum Bilirubin Last labs 09/2016 with elevated total serum bilirubin to 2.2, previously he has had prior elevated T bili as well. He has not been diagnosed with any condition to explain this. He is not worried about it today, without any other symptoms. - Denies any abdominal pain, nausea, vomiting, diarrhea, heavy alcohol consumption   GAD 7 : Generalized Anxiety Score 11/14/2016 10/02/2016 09/06/2015  Nervous, Anxious, on Edge 3 (No Data) 3  Control/stop worrying 2 - 2  Worry too much - different things 3 - 2  Trouble relaxing 2 - 3  Restless 1 - 3  Easily annoyed or irritable 2 - 2  Afraid - awful might happen 2 - 0  Total GAD 7 Score 15 - 15  Anxiety Difficulty Somewhat difficult - Somewhat difficult    Social History  Substance Use Topics  . Smoking status: Never Smoker  . Smokeless tobacco: Never Used  . Alcohol use No    Review of Systems  Constitutional: Negative for activity change, appetite change, chills, diaphoresis, fatigue and fever.  HENT: Negative for congestion and hearing loss.   Eyes: Negative for visual disturbance.  Respiratory: Negative for cough, chest tightness, shortness of breath and wheezing.   Cardiovascular: Negative for chest pain, palpitations and leg swelling.  Gastrointestinal: Negative for abdominal pain, constipation, diarrhea, nausea and vomiting.  Genitourinary: Negative for dysuria, frequency and hematuria.  Musculoskeletal: Negative for arthralgias and neck pain.  Skin: Negative for rash.  Neurological: Negative  for dizziness, weakness, light-headedness, numbness and headaches.  Hematological: Negative for adenopathy.  Psychiatric/Behavioral: Negative for behavioral problems, dysphoric mood and sleep disturbance.   Per HPI unless specifically indicated above     Objective:    BP 131/75   Pulse 75   Temp 98.5 F (36.9 C) (Oral)   Resp 16   Ht   (1.905 m)   Wt 224 lb 9.6 oz (101.9 kg)   BMI 28.07 kg/m   Wt Readings from Last 3 Encounters:  11/14/16 224 lb 9.6 oz (101.9 kg)  10/02/16 222 lb 3.2 oz (100.8 kg)  01/12/16 217 lb 6.4 oz (98.6 kg)    Physical Exam  Constitutional: He is oriented to person, place, and time. He appears well-developed and well-nourished. No distress.  Well-appearing, comfortable, cooperative  HENT:  Head: Normocephalic and atraumatic.  Mouth/Throat: Oropharynx is clear and moist.  Eyes: Conjunctivae are normal. Pupils are equal, round, and reactive to light.  Neck: Normal range of motion. Neck supple.  Cardiovascular: Normal rate, regular rhythm, normal heart sounds and intact distal pulses.   No murmur heard. No ectopy  Pulmonary/Chest: Effort normal and breath sounds normal. No respiratory distress. He has no wheezes. He has no rales.  Musculoskeletal: He exhibits no edema.  Neurological: He is alert and oriented to person, place, and time.  Skin: Skin is warm and dry. He is not diaphoretic.  Psychiatric: He has a normal mood and affect. His behavior is normal.  Well groomed, good eye contact, normal speech and thoughts, essentially stable since last visit does appear mildly anxious at times when describing symptoms otherwise he seems very calm and comfortable  Nursing note and vitals reviewed.   I have personally reviewed the following lab results from 10/02/16.  Results for orders placed or performed in visit on 10/02/16  GC/Chlamydia Probe Amp  Result Value Ref Range   Chlamydia trachomatis, NAA Negative Negative   Neisseria gonorrhoeae by PCR Negative Negative  Comprehensive metabolic panel  Result Value Ref Range   Glucose 99 65 - 99 mg/dL   BUN 23 (H) 6 - 20 mg/dL   Creatinine, Ser 0.98 0.76 - 1.27 mg/dL   GFR calc non Af Amer 100 >59 mL/min/1.73   GFR calc Af Amer 116 >59 mL/min/1.73   BUN/Creatinine Ratio 22 (H) 9 - 20   Sodium 139 134 - 144 mmol/L   Potassium 4.8 3.5 - 5.2 mmol/L     Chloride 99 96 - 106 mmol/L   CO2 25 18 - 29 mmol/L   Calcium 10.2 8.7 - 10.2 mg/dL   Total Protein 7.7 6.0 - 8.5 g/dL   Albumin 4.9 3.5 - 5.5 g/dL   Globulin, Total 2.8 1.5 - 4.5 g/dL   Albumin/Globulin Ratio 1.8 1.2 - 2.2   Bilirubin Total 2.2 (H) 0.0 - 1.2 mg/dL   Alkaline Phosphatase 63 39 - 117 IU/L   AST 24 0 - 40 IU/L   ALT 21 0 - 44 IU/L  TSH  Result Value Ref Range   TSH 0.974 0.450 - 4.500 uIU/mL  RPR  Result Value Ref Range   RPR Ser Ql Non Reactive Non Reactive  HIV antibody  Result Value Ref Range   HIV Screen 4th Generation wRfx Non Reactive Non Reactive      Assessment & Plan:   Problem List Items Addressed This Visit    Serum total bilirubin elevated    Elevated T Bili 2.2 in last chemistry, previously had mild elevated T Bili  to 1.3 Question possible Gillbert's syndrome Asymptomatic Will re-check CMET with LFTs and add fractionated bilirubin in 6-12 months with future labs      Intermittent palpitations    Stable, intermittent brief palpitations, closely related to anxiety/panic, patient seems hyper-aware of these palpitations. No sustained episodes and not seem to trigger anxiety or other systemic symptoms. No cardiac risk symptoms. - Labs chemistry and TSH normal, without underlying etiology  Plan: 1. Reassurance, manage underlying anxiety, start SSRI Lexapro 2. Monitor symptoms and follow-up if worsening despite controlling anxiety, advised consider EKG, vs cardiology referral for temporary cardiac monitor, discussed etiology with likely PVCs etc 3. In future also discussed option for possible beta blocker low dose to help control palpitations if these are source of anxiety      Generalized anxiety disorder - Primary    Persistent worsening anxiety, suspected generalized anxiety disorder (GAD) with chronic anxiety and panic attacks, seems intermittent based on life stressors - GAD7 15 - Prior inconsistent dosing Escitalopram  - Uses self  therapy behavioral techniques - Not established with Psychiatry/Psychology  Plan: 1. Discussion today on diagnosis, management and prognosis of chronic anxiety and the manifestation of anxiety with his panic attacks, palpitations, insomnia and other symptoms 2. Start Escitalopram  daily AM with food (if too much sedation can switch to PM dosing again, caution insomnia), counseling on potential side effects risks, reviewed possible GI intolerance, insomnia (although likely to improve this given anxiety likely source of insomnia), sexual dysfunction, reviewed black box warning inc suicidal (no prior history, unlikely concern) - anticipate 4-6 weeks for notable effect, may need titrate dose to 20 in future 3. Advised recommend therapy / counseling in future - already given handout locations/names if need, and  not ready at this time, will re-consider in future if needed 4. Follow-up 4-6 weeks anxiety, med adjust, GAD7/PHQ9 - additionally offered Hydroxyzine for PRN insomnia/panic, declined, will continue benadryl OTC PRN      Relevant Medications   escitalopram (LEXAPRO) 10 MG tablet     Meds ordered this encounter  Medications  . escitalopram (LEXAPRO) 10 MG tablet    Sig: Take 1 tablet (10 mg total) by mouth daily.    Dispense:  30 tablet    Refill:  5    Follow up plan: Return in about 4 weeks (around 12/12/2016) for 4-6 weeks for follow-up anxiety, GAD7.  Saralyn Pilar, DO [facility] 11/14/2016, 1:06 PM

## 2017-05-22 ENCOUNTER — Other Ambulatory Visit: Payer: Self-pay

## 2017-05-22 DIAGNOSIS — F411 Generalized anxiety disorder: Secondary | ICD-10-CM

## 2017-05-22 MED ORDER — ESCITALOPRAM OXALATE 10 MG PO TABS: 10.0000 mg | ORAL_TABLET | Freq: Every day | ORAL | 1 refills | Status: DC

## 2024-06-17 ENCOUNTER — Ambulatory Visit: Admitting: Family Medicine

## 2024-06-17 ENCOUNTER — Encounter: Payer: Self-pay | Admitting: Family Medicine

## 2024-06-17 VITALS — BP 132/84 | HR 52 | Ht 75.0 in | Wt 289.2 lb

## 2024-06-17 DIAGNOSIS — Z7689 Persons encountering health services in other specified circumstances: Secondary | ICD-10-CM

## 2024-06-17 DIAGNOSIS — R002 Palpitations: Secondary | ICD-10-CM | POA: Diagnosis not present

## 2024-06-17 DIAGNOSIS — Z13220 Encounter for screening for lipoid disorders: Secondary | ICD-10-CM | POA: Diagnosis not present

## 2024-06-17 DIAGNOSIS — F411 Generalized anxiety disorder: Secondary | ICD-10-CM

## 2024-06-17 DIAGNOSIS — Z131 Encounter for screening for diabetes mellitus: Secondary | ICD-10-CM

## 2024-06-17 DIAGNOSIS — R221 Localized swelling, mass and lump, neck: Secondary | ICD-10-CM

## 2024-06-17 MED ORDER — BUSPIRONE HCL 5 MG PO TABS
5.0000 mg | ORAL_TABLET | Freq: Three times a day (TID) | ORAL | 1 refills | Status: AC | PRN
Start: 2024-06-17 — End: ?

## 2024-06-17 NOTE — Progress Notes (Unsigned)
 Subjective:    Patient ID: Justin Mccarthy, male    DOB: 1990-08-30, 34 y.o.   MRN: [id number]  Justin Mccarthy is a 33 y.o. male presenting on 06/17/2024 for Establish Care   HPI  Discussed the use of AI scribe software for clinical note transcription with the patient, who gave verbal consent to proceed.  History of Present Illness   ***New establish  Head Cold months ago, took while, resolved, and has residual R anterior neck gland, non tender, not affecting swallowing   ***Intermittent palpitations Works for [employer], drinks [employer] and soda ***   Health Maintenance: ***  Hep B 08/01/1991, 05/11/1992, 12/03/1992  MMR 12/03/1992, 03/12/1996  No HPV  Meningo 01/13/2008     06/17/2024    8:47 AM  Depression screen PHQ 2/9  Decreased Interest 0  Down, Depressed, Hopeless 0  PHQ - 2 Score 0  Altered sleeping 2  Tired, decreased energy 0  Feeling bad or failure about yourself  0  Trouble concentrating 0  Moving slowly or fidgety/restless 0  Suicidal thoughts 0  PHQ-9 Score 2  Difficult doing work/chores Somewhat difficult       06/17/2024    8:47 AM 11/14/2016   10:01 AM 10/02/2016    3:07 PM 09/06/2015   10:27 AM  GAD 7 : Generalized Anxiety Score  Nervous, Anxious, on Edge 2 3 -- 3  Control/stop worrying 2 2  2   Worry too much - different things 2 3  2   Trouble relaxing 2 2  3   Restless 0 1  3  Easily annoyed or irritable 0 2  2  Afraid - awful might happen 0 2  0  Total GAD 7 Score 8 15  15   Anxiety Difficulty Somewhat difficult Somewhat difficult  Somewhat difficult     Past Medical History:  Diagnosis Date   Anxiety    Generalized anxiety disorder    History reviewed. No pertinent surgical history. 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   No children.   Works at [employer].    Enjoys working out at [employer]   Social Drivers of Corporate Investment Banker Strain: Not on file  Food Insecurity: Not on file  Transportation Needs: Not on file  Physical Activity: Not on file  Stress: Not on file  Social Connections: Not on file  Intimate Partner Violence: Not on file   Family History  Problem Relation Age of Onset   Kidney disease Maternal Grandmother    Anxiety disorder Mother    No current outpatient medications on file prior to visit.   No current facility-administered medications on file prior to visit.    Review of Systems Per HPI unless specifically indicated above     Objective:    BP 132/84 (BP Location: Right Arm, Patient Position: Sitting, Cuff Size: Large)   Pulse (!) 52   Ht 6' 3 (1.905 m)   Wt  289 lb 4 oz (131.2 kg)   SpO2 95%   BMI 36.15 kg/m   Wt Readings from Last 3 Encounters:  06/17/24 289 lb 4 oz (131.2 kg)  11/14/16 224 lb 9.6 oz (101.9 kg)  10/02/16 222 lb 3.2 oz (100.8 kg)    Physical Exam  Results for orders placed or performed in visit on 10/02/16  GC/Chlamydia Probe Amp   Collection Time: 10/03/16  9:47 AM   UR  Result Value Ref Range   Chlamydia trachomatis, NAA Negative Negative   Neisseria gonorrhoeae by PCR Negative Negative  Comprehensive metabolic panel   Collection Time: 10/03/16  9:47 AM  Result Value Ref Range   Glucose 99 65 - 99 mg/dL   BUN 23 (H) 6 - 20 mg/dL   Creatinine, Ser 8.96 0.76 - 1.27 mg/dL   GFR calc non Af Amer 100 >59 mL/min/1.73   GFR calc Af Amer 116 >59 mL/min/1.73   BUN/Creatinine Ratio 22 (H) 9 - 20   Sodium 139 134 - 144 mmol/L   Potassium 4.8 3.5 - 5.2 mmol/L   Chloride 99 96 - 106  mmol/L   CO2 25 18 - 29 mmol/L   Calcium 10.2 8.7 - 10.2 mg/dL   Total Protein 7.7 6.0 - 8.5 g/dL   Albumin 4.9 3.5 - 5.5 g/dL   Globulin, Total 2.8 1.5 - 4.5 g/dL   Albumin/Globulin Ratio 1.8 1.2 - 2.2   Bilirubin Total 2.2 (H) 0.0 - 1.2 mg/dL   Alkaline Phosphatase 63 39 - 117 IU/L   AST 24 0 - 40 IU/L   ALT 21 0 - 44 IU/L  TSH   Collection Time: 10/03/16  9:47 AM  Result Value Ref Range   TSH 0.974 0.450 - 4.500 uIU/mL  RPR   Collection Time: 10/03/16  9:47 AM  Result Value Ref Range   RPR Ser Ql Non Reactive Non Reactive  HIV antibody   Collection Time: 10/03/16  9:47 AM  Result Value Ref Range   HIV Screen 4th Generation wRfx Non Reactive Non Reactive      Assessment & Plan:   Problem List Items Addressed This Visit     GAD (generalized anxiety disorder) - Primary   Relevant Medications   busPIRone (BUSPAR) 5 MG tablet   Intermittent palpitations   Other Visit Diagnoses       Encounter to establish care with new doctor         Screening cholesterol level       Relevant Orders   Lipid panel     Screening for diabetes mellitus (DM)       Relevant Orders   Hemoglobin A1c     Mass of right side of neck       Relevant Orders   US  Soft Tissue Head/Neck (NON-THYROID)        Updated Health Maintenance information Reviewed recent lab results with patient Encouraged improvement to lifestyle with diet and exercise Goal of weight loss  Assessment and Plan Assessment & Plan      Orders Placed This Encounter  Procedures   US  Soft Tissue Head/Neck (NON-THYROID)    Standing Status:   Future    Expiration Date:   06/17/2025    Reason for Exam (SYMPTOM  OR DIAGNOSIS REQUIRED):   >4 month Right anterior upper neck mass 2 cm non tender, onset after URI but has not resolved    Preferred imaging location?:   [facility]   TSH   Lipid panel  Has the patient fasted?:   Yes   Hemoglobin A1c   CBC with Differential/Platelet   Comprehensive metabolic  panel with GFR    Has the patient fasted?:   Yes    Meds ordered this encounter  Medications   busPIRone (BUSPAR) 5 MG tablet    Sig: Take 1 tablet (5 mg total) by mouth 3 (three) times daily as needed (anxiety).    Dispense:  90 tablet    Refill:  1     Follow up plan: Return in about 3 months (around 09/17/2024) for 3 month follow-up Anxiety, Palpitations, Neck US  result.  Marsa Officer, DO [facility] 06/17/2024, 9:27 AM

## 2024-06-17 NOTE — Patient Instructions (Addendum)
 Thank you for coming to the office today.  Check insurance cost of HPV Vaccine series 3 doses, can get here or at pharmacy.  Shot record  You are immune to Hepatitis B  Labs today, stay tuned for results.  Consider ZIO Patch Monitor to evaluate in future  Buspar recommendation - this is good for short term as needed anxiety, can take regularly or intermittent, 5 mg dose up to 3 times per day as needed.  Let me know if questions or need more help. Consider therapist in future.  ------------------------  For the neck swelling spot, referred to Ultrasound imaging.  [facility] at [facility] Address: [address] [place], [place] [address] Phone: [phone]  Please schedule a Follow-up Appointment to: Return in about 3 months (around 09/17/2024) for 3 month follow-up Anxiety, Palpitations, Neck US  result.  If you have any other questions or concerns, please feel free to call the office or send a message through MyChart. You may also schedule an earlier appointment if necessary.  Additionally, you may be receiving a survey about your experience at our office within a few days to 1 week by e-mail or mail. We value your feedback.  Marsa Officer, DO [facility] [place]

## 2024-06-19 ENCOUNTER — Encounter: Payer: Self-pay | Admitting: Family Medicine

## 2024-06-19 ENCOUNTER — Ambulatory Visit: Payer: Self-pay | Admitting: Family Medicine

## 2024-06-19 DIAGNOSIS — R7309 Other abnormal glucose: Secondary | ICD-10-CM | POA: Insufficient documentation

## 2024-06-19 DIAGNOSIS — R591 Generalized enlarged lymph nodes: Secondary | ICD-10-CM

## 2024-06-19 DIAGNOSIS — E7800 Pure hypercholesterolemia, unspecified: Secondary | ICD-10-CM | POA: Insufficient documentation

## 2024-06-19 LAB — CBC WITH DIFFERENTIAL/PLATELET
Absolute Lymphocytes: 1825 {cells}/uL (ref 850–3900)
Absolute Monocytes: 454 {cells}/uL (ref 200–950)
Basophils Absolute: 21 {cells}/uL (ref 0–200)
Basophils Relative: 0.3 %
Eosinophils Absolute: 50 {cells}/uL (ref 15–500)
Eosinophils Relative: 0.7 %
HCT: 42 % (ref 38.5–50.0)
Hemoglobin: 13.8 g/dL (ref 13.2–17.1)
MCH: 28.7 pg (ref 27.0–33.0)
MCHC: 32.9 g/dL (ref 32.0–36.0)
MCV: 87.3 fL (ref 80.0–100.0)
MPV: 11.7 fL (ref 7.5–12.5)
Monocytes Relative: 6.4 %
Neutro Abs: 4750 {cells}/uL (ref 1500–7800)
Neutrophils Relative %: 66.9 %
Platelets: 248 Thousand/uL (ref 140–400)
RBC: 4.81 Million/uL (ref 4.20–5.80)
RDW: 12.6 % (ref 11.0–15.0)
Total Lymphocyte: 25.7 %
WBC: 7.1 Thousand/uL (ref 3.8–10.8)

## 2024-06-19 LAB — COMPREHENSIVE METABOLIC PANEL WITH GFR
AG Ratio: 1.5 (calc) (ref 1.0–2.5)
ALT: 23 U/L (ref 9–46)
AST: 22 U/L (ref 10–40)
Albumin: 4.8 g/dL (ref 3.6–5.1)
Alkaline phosphatase (APISO): 75 U/L (ref 36–130)
BUN: 11 mg/dL (ref 7–25)
CO2: 23 mmol/L (ref 20–32)
Calcium: 10.3 mg/dL (ref 8.6–10.3)
Chloride: 103 mmol/L (ref 98–110)
Creat: 1.07 mg/dL (ref 0.60–1.26)
Globulin: 3.3 g/dL (ref 1.9–3.7)
Glucose, Bld: 93 mg/dL (ref 65–99)
Potassium: 4.6 mmol/L (ref 3.5–5.3)
Sodium: 140 mmol/L (ref 135–146)
Total Bilirubin: 0.7 mg/dL (ref 0.2–1.2)
Total Protein: 8.1 g/dL (ref 6.1–8.1)
eGFR: 95 mL/min/1.73m2 (ref >=60)

## 2024-06-19 LAB — LIPID PANEL
Cholesterol: 196 mg/dL (ref <200)
HDL: 61 mg/dL (ref >=40)
LDL Cholesterol (Calc): 115 mg/dL — ABNORMAL HIGH
Non-HDL Cholesterol (Calc): 135 mg/dL — ABNORMAL HIGH (ref <130)
Total CHOL/HDL Ratio: 3.2 (calc) (ref <5.0)
Triglycerides: 98 mg/dL (ref <150)

## 2024-06-19 LAB — HEMOGLOBIN A1C
Hgb A1c MFr Bld: 5.9 % — ABNORMAL HIGH (ref <5.7)
Mean Plasma Glucose: 123 mg/dL
eAG (mmol/L): 6.8 mmol/L

## 2024-06-19 LAB — TSH: TSH: 2.34 m[IU]/L (ref 0.40–4.50)

## 2024-06-23 ENCOUNTER — Ambulatory Visit
Admission: RE | Admit: 2024-06-23 | Discharge: 2024-06-23 | Disposition: A | Discharge status: Home / Self Care | Source: Ambulatory Visit | Attending: Family Medicine | Admitting: Family Medicine

## 2024-06-23 DIAGNOSIS — R221 Localized swelling, mass and lump, neck: Secondary | ICD-10-CM | POA: Insufficient documentation

## 2024-07-10 ENCOUNTER — Other Ambulatory Visit: Payer: Self-pay | Admitting: Unknown Physician Specialty

## 2024-07-10 DIAGNOSIS — R599 Enlarged lymph nodes, unspecified: Secondary | ICD-10-CM

## 2024-07-22 ENCOUNTER — Telehealth: Payer: Self-pay

## 2024-07-22 NOTE — Telephone Encounter (Signed)
 Patient for US  guided core RT LN Biopsy on Friday 07/25/24, I called and spoke with the patient on the phone and gave pre-procedure instructions. Pt was made aware to be here at 12:30p and check in at the [facility] registration desk. Pt stated understanding. Called 07/22/24

## 2024-07-25 ENCOUNTER — Ambulatory Visit
Admission: RE | Admit: 2024-07-25 | Discharge: 2024-07-25 | Disposition: A | Discharge status: Home / Self Care | Source: Ambulatory Visit | Attending: Unknown Physician Specialty | Admitting: Unknown Physician Specialty

## 2024-07-25 DIAGNOSIS — R599 Enlarged lymph nodes, unspecified: Secondary | ICD-10-CM

## 2024-07-25 DIAGNOSIS — R591 Generalized enlarged lymph nodes: Secondary | ICD-10-CM | POA: Insufficient documentation

## 2024-07-25 MED ORDER — LIDOCAINE HCL (PF) 1 % IJ SOLN
10.0000 mL | Freq: Once | INTRAMUSCULAR | Status: AC
  Administered 2024-07-25: 10 mL via INTRADERMAL

## 2024-07-25 NOTE — Procedures (Signed)
" °  Procedure:  US  core biopsy R cervical adenopathy 18g x4 Preprocedure diagnosis: The encounter diagnosis was Lymph node enlargement. Postprocedure diagnosis: same EBL:    minimal Complications:   none immediate  See full dictation in [employer].  CHARM Toribio Faes MD Main # [phone] Pager  [phone] Mobile [phone]    "

## 2024-07-30 LAB — SURGICAL PATHOLOGY

## 2024-09-15 ENCOUNTER — Ambulatory Visit: Admitting: Family Medicine
